# Patient Record
Sex: Male | Born: 1985 | Race: White | Hispanic: No | Marital: Married | Smoking: Never smoker
Health system: Southern US, Community
[De-identification: ages and names within clinical notes are randomized; demographics above are authoritative.]

## PROBLEM LIST (undated history)

## (undated) DIAGNOSIS — E0590 Thyrotoxicosis, unspecified without thyrotoxic crisis or storm: Secondary | ICD-10-CM

## (undated) DIAGNOSIS — F419 Anxiety disorder, unspecified: Secondary | ICD-10-CM

---

## 2013-08-16 ENCOUNTER — Encounter (HOSPITAL_COMMUNITY): Payer: Self-pay | Admitting: Emergency Medicine

## 2013-08-16 ENCOUNTER — Emergency Department (HOSPITAL_COMMUNITY): Payer: Self-pay

## 2013-08-16 ENCOUNTER — Emergency Department (HOSPITAL_COMMUNITY)
Admission: EM | Admit: 2013-08-16 | Discharge: 2013-08-17 | Disposition: A | Discharge status: Home / Self Care | Payer: Self-pay | Attending: Emergency Medicine | Admitting: Emergency Medicine

## 2013-08-16 DIAGNOSIS — T07XXXA Unspecified multiple injuries, initial encounter: Secondary | ICD-10-CM | POA: Insufficient documentation

## 2013-08-16 DIAGNOSIS — M7918 Myalgia, other site: Secondary | ICD-10-CM

## 2013-08-16 DIAGNOSIS — Z79899 Other long term (current) drug therapy: Secondary | ICD-10-CM | POA: Insufficient documentation

## 2013-08-16 DIAGNOSIS — F411 Generalized anxiety disorder: Secondary | ICD-10-CM | POA: Insufficient documentation

## 2013-08-16 DIAGNOSIS — E0590 Thyrotoxicosis, unspecified without thyrotoxic crisis or storm: Secondary | ICD-10-CM | POA: Insufficient documentation

## 2013-08-16 DIAGNOSIS — F1010 Alcohol abuse, uncomplicated: Secondary | ICD-10-CM | POA: Insufficient documentation

## 2013-08-16 DIAGNOSIS — W108XXA Fall (on) (from) other stairs and steps, initial encounter: Secondary | ICD-10-CM | POA: Insufficient documentation

## 2013-08-16 DIAGNOSIS — S40019A Contusion of unspecified shoulder, initial encounter: Secondary | ICD-10-CM | POA: Insufficient documentation

## 2013-08-16 DIAGNOSIS — Y9289 Other specified places as the place of occurrence of the external cause: Secondary | ICD-10-CM | POA: Insufficient documentation

## 2013-08-16 DIAGNOSIS — W19XXXA Unspecified fall, initial encounter: Secondary | ICD-10-CM

## 2013-08-16 DIAGNOSIS — R109 Unspecified abdominal pain: Secondary | ICD-10-CM | POA: Insufficient documentation

## 2013-08-16 DIAGNOSIS — Z880 Allergy status to penicillin: Secondary | ICD-10-CM | POA: Insufficient documentation

## 2013-08-16 DIAGNOSIS — F919 Conduct disorder, unspecified: Secondary | ICD-10-CM | POA: Insufficient documentation

## 2013-08-16 DIAGNOSIS — Y939 Activity, unspecified: Secondary | ICD-10-CM | POA: Insufficient documentation

## 2013-08-16 HISTORY — DX: Anxiety disorder, unspecified: F41.9

## 2013-08-16 HISTORY — DX: Thyrotoxicosis, unspecified without thyrotoxic crisis or storm: E05.90

## 2013-08-16 LAB — RAPID URINE DRUG SCREEN, HOSP PERFORMED
Amphetamines: NOT DETECTED
Barbiturates: NOT DETECTED
Benzodiazepines: NOT DETECTED
COCAINE: NOT DETECTED
Opiates: NOT DETECTED
Tetrahydrocannabinol: NOT DETECTED

## 2013-08-16 LAB — PROTIME-INR
INR: 0.84 (ref 0.00–1.49)
Prothrombin Time: 11.4 seconds — ABNORMAL LOW (ref 11.6–15.2)

## 2013-08-16 LAB — COMPREHENSIVE METABOLIC PANEL
ALBUMIN: 4.2 g/dL (ref 3.5–5.2)
ALT: 24 U/L (ref 0–53)
AST: 24 U/L (ref 0–37)
Alkaline Phosphatase: 50 U/L (ref 39–117)
BILIRUBIN TOTAL: 0.3 mg/dL (ref 0.3–1.2)
BUN: 11 mg/dL (ref 6–23)
CHLORIDE: 102 meq/L (ref 96–112)
CO2: 20 mEq/L (ref 19–32)
CREATININE: 1.11 mg/dL (ref 0.50–1.35)
Calcium: 9.6 mg/dL (ref 8.4–10.5)
GFR calc Af Amer: >90 mL/min (ref >90)
GFR calc non Af Amer: 88 mL/min — ABNORMAL LOW (ref >90)
Glucose, Bld: 89 mg/dL (ref 70–99)
Potassium: 3.8 mEq/L (ref 3.7–5.3)
Sodium: 138 mEq/L (ref 137–147)
Total Protein: 7.7 g/dL (ref 6.0–8.3)

## 2013-08-16 LAB — CBC
HEMATOCRIT: 42.3 % (ref 39.0–52.0)
Hemoglobin: 14.9 g/dL (ref 13.0–17.0)
MCH: 30.7 pg (ref 26.0–34.0)
MCHC: 35.2 g/dL (ref 30.0–36.0)
MCV: 87 fL (ref 78.0–100.0)
PLATELETS: 292 10*3/uL (ref 150–400)
RBC: 4.86 MIL/uL (ref 4.22–5.81)
RDW: 12.4 % (ref 11.5–15.5)
WBC: 9 10*3/uL (ref 4.0–10.5)

## 2013-08-16 LAB — ETHANOL: Alcohol, Ethyl (B): 233 mg/dL — ABNORMAL HIGH (ref 0–11)

## 2013-08-16 LAB — SAMPLE TO BLOOD BANK

## 2013-08-16 LAB — I-STAT CG4 LACTIC ACID, ED
LACTIC ACID, VENOUS: 2.23 mmol/L — AB (ref 0.5–2.2)
LACTIC ACID, VENOUS: 1.6 mmol/L (ref 0.5–2.2)

## 2013-08-16 MED ORDER — MORPHINE SULFATE 2 MG/ML IJ SOLN
2.0000 mg | Freq: Once | INTRAMUSCULAR | Status: AC
  Administered 2013-08-16: 2 mg via INTRAVENOUS
  Filled 2013-08-16: qty 1

## 2013-08-16 MED ORDER — HYDROCODONE-ACETAMINOPHEN 5-325 MG PO TABS
1.0000 | ORAL_TABLET | Freq: Once | ORAL | Status: AC
  Administered 2013-08-16: 1 via ORAL
  Filled 2013-08-16: qty 1

## 2013-08-16 MED ORDER — HYDROCODONE-ACETAMINOPHEN 5-325 MG PO TABS
1.0000 | ORAL_TABLET | Freq: Four times a day (QID) | ORAL | Status: AC | PRN
Start: 2013-08-16 — End: ?

## 2013-08-16 MED ORDER — SODIUM CHLORIDE 0.9 % IV BOLUS (SEPSIS)
1000.0000 mL | Freq: Once | INTRAVENOUS | Status: AC
  Administered 2013-08-16: 1000 mL via INTRAVENOUS

## 2013-08-16 MED ORDER — IOHEXOL 300 MG/ML  SOLN
100.0000 mL | Freq: Once | INTRAMUSCULAR | Status: AC | PRN
  Administered 2013-08-16: 100 mL via INTRAVENOUS

## 2013-08-16 NOTE — ED Notes (Signed)
Dr Lockwood given a copy of lactic acid results 2.23 

## 2013-08-16 NOTE — Progress Notes (Signed)
Chaplain responded to trauma, level 2. Upon consulting with nursing staff, learned that patient had no emotional/spiritual needs at this time. Please page for follow up.   Maurene CapesHillary D Irusta [phone]

## 2013-08-16 NOTE — Discharge Instructions (Signed)
Musculoskeletal Pain °Musculoskeletal pain is muscle and boney aches and pains. These pains can occur in any part of the body. Your caregiver may treat you without knowing the cause of the pain. They may treat you if blood or urine tests, X-rays, and other tests were normal.  °CAUSES °There is often not a definite cause or reason for these pains. These pains may be caused by a type of germ (virus). The discomfort may also come from overuse. Overuse includes working out too hard when your body is not fit. Boney aches also come from weather changes. Bone is sensitive to atmospheric pressure changes. °HOME CARE INSTRUCTIONS  °· Ask when your test results will be ready. Make sure you get your test results. °· Only take over-the-counter or prescription medicines for pain, discomfort, or fever as directed by your caregiver. If you were given medications for your condition, do not drive, operate machinery or power tools, or sign legal documents for 24 hours. Do not drink alcohol. Do not take sleeping pills or other medications that may interfere with treatment. °· Continue all activities unless the activities cause more pain. When the pain lessens, slowly resume normal activities. Gradually increase the intensity and duration of the activities or exercise. °· During periods of severe pain, bed rest may be helpful. Lay or sit in any position that is comfortable. °· Putting ice on the injured area. °· Put ice in a bag. °· Place a towel between your skin and the bag. °· Leave the ice on for 15 to 20 minutes, 3 to 4 times a day. °· Follow up with your caregiver for continued problems and no reason can be found for the pain. If the pain becomes worse or does not go away, it may be necessary to repeat tests or do additional testing. Your caregiver may need to look further for a possible cause. °SEEK IMMEDIATE MEDICAL CARE IF: °· You have pain that is getting worse and is not relieved by medications. °· You develop chest pain  that is associated with shortness or breath, sweating, feeling sick to your stomach (nauseous), or throw up (vomit). °· Your pain becomes localized to the abdomen. °· You develop any new symptoms that seem different or that concern you. °MAKE SURE YOU:  °· Understand these instructions. °· Will watch your condition. °· Will get help right away if you are not doing well or get worse. °Document Released: 04/16/2005 Document Revised: 07/09/2011 Document Reviewed: 12/19/2012 °ExitCare® Patient Information ©2014 [employer]. ° °

## 2013-08-16 NOTE — ED Notes (Signed)
Per [facility] - pt from a friends house this evening, w/ heavy ETOH on board, pt fell down front porch stairs approx 14 (7-388ft) and ambulatory on scene - per EMS pt combative en route while trying to immobilize pt. On arrival pt A&Ox4 agitated w/ c-collar - c/o lower back pain, left shoulder pain (ecchymosis noted to left shoulder), MAE.

## 2013-08-16 NOTE — ED Notes (Signed)
Family at beside. Family given emotional support. 

## 2013-08-16 NOTE — ED Notes (Signed)
Patient transported to X-ray 

## 2013-08-16 NOTE — ED Provider Notes (Signed)
CSN: [id number]     Arrival date & time 08/16/13  2021 History   First MD Initiated Contact with Patient 08/16/13 2039     Chief Complaint  Patient presents with  . Trauma  . Fall     (Consider location/radiation/quality/duration/timing/severity/associated sxs/prior Treatment) HPI  This is a 28 y.o. male with PMH of hyperthyroidism, anxiety, presenting as level II trauma with pain after fall. Pain is in left shoulder, left upper arm, lower back. Onset prior to arrival. Persistent. Cannot characterize. No meds taken. No radiation. Patient denies weakness, numbness, tingling. Positive for abdominal pain as well.  Mechanism a fall prior to arrival. Patient was intoxicated with alcohol. Felt down at estimated 8 feet of steps. Unsure if he hit his neck. Negative for loss of consciousness. Negative for amnesia. Patient was ambulatory after incident. Upon arrival of EMS, patient was combative; however, they were able to calm him down, place him in a cervical collar, place him on a hard backboard, and transported him here. Unable to take blood pressure in transit due to combativeness.  Past Medical History  Diagnosis Date  . Hyperthyroidism   . Anxiety    History reviewed. No pertinent past surgical history. History reviewed. No pertinent family history. History  Substance Use Topics  . Smoking status: Never Smoker   . Smokeless tobacco: Not on file  . Alcohol Use: Yes    Review of Systems  Constitutional: Negative for fever and chills.  HENT: Negative for facial swelling.   Eyes: Negative for pain and visual disturbance.  Respiratory: Negative for chest tightness and shortness of breath.   Cardiovascular: Negative for chest pain.  Gastrointestinal: Positive for abdominal pain. Negative for nausea and vomiting.  Genitourinary: Negative for dysuria.  Musculoskeletal: Positive for arthralgias and back pain. Negative for myalgias.  Skin: Positive for color change.  Neurological:  Negative for headaches.  Psychiatric/Behavioral: Positive for behavioral problems (combatitiveness has resolved).      Allergies  Penicillins  Home Medications   Prior to Admission medications   Medication Sig Start Date End Date Taking? Authorizing Provider  FLUoxetine (PROZAC) 40 MG capsule Take 40 mg by mouth daily.   Yes Historical Provider, MD  ibuprofen (ADVIL,MOTRIN) 200 MG tablet Take 600 mg by mouth every 6 (six) hours as needed for mild pain.   Yes Historical Provider, MD  levothyroxine (SYNTHROID, LEVOTHROID) 175 MCG tablet Take 175 mcg by mouth daily before breakfast.   Yes Historical Provider, MD  LORazepam (ATIVAN) 1 MG tablet Take 1 mg by mouth 2 (two) times daily.   Yes Historical Provider, MD   BP 164/84  Pulse 130  Temp(Src) 99.3 F (37.4 C) (Oral)  Resp 28  Ht 5\' 7"  (1.702 m)  Wt 250 lb (113.399 kg)  BMI 39.15 kg/m2  SpO2 100% Physical Exam  Constitutional: He is oriented to person, place, and time. He appears well-developed and well-nourished. No distress.  HENT:  Head: Normocephalic and atraumatic.  Mouth/Throat: No oropharyngeal exudate.  Eyes: Conjunctivae are normal. Pupils are equal, round, and reactive to light. No scleral icterus.  Neck: Normal range of motion. No tracheal deviation present. No thyromegaly present.  Cardiovascular: Normal rate, regular rhythm and normal heart sounds.  Exam reveals no gallop and no friction rub.   No murmur heard. Pulmonary/Chest: Effort normal and breath sounds normal. No stridor. No respiratory distress. He has no wheezes. He has no rales. He exhibits no tenderness.  Abdominal: Soft. He exhibits no distension and no mass. There is  tenderness. There is no rebound and no guarding.  Musculoskeletal: Normal range of motion. He exhibits no edema.  Positive for midline TTP at around T10 without stepoff or deformities  Neurological: He is alert and oriented to person, place, and time.  Skin: Skin is warm and dry. He is  not diaphoretic.  Ecchymosis to the left upper arm.  LUE is NVI    ED Course  Procedures (including critical care time) Labs Review Labs Reviewed  COMPREHENSIVE METABOLIC PANEL - Abnormal; Notable for the following:    GFR calc non Af Amer 88 (*)    All other components within normal limits  ETHANOL - Abnormal; Notable for the following:    Alcohol, Ethyl (B) 233 (*)    All other components within normal limits  PROTIME-INR - Abnormal; Notable for the following:    Prothrombin Time 11.4 (*)    All other components within normal limits  I-STAT CG4 LACTIC ACID, ED - Abnormal; Notable for the following:    Lactic Acid, Venous 2.23 (*)    All other components within normal limits  CBC  URINE RAPID DRUG SCREEN (HOSP PERFORMED)  CDS SEROLOGY  I-STAT CG4 LACTIC ACID, ED  SAMPLE TO BLOOD BANK    Imaging Review Ct Head Wo Contrast  08/16/2013   CLINICAL DATA:  Trauma  EXAM: CT HEAD WITHOUT CONTRAST  CT CERVICAL SPINE WITHOUT CONTRAST  TECHNIQUE: Multidetector CT imaging of the head and cervical spine was performed following the standard protocol without intravenous contrast. Multiplanar CT image reconstructions of the cervical spine were also generated.  COMPARISON:  CT ABD/PELVIS W CM dated 08/16/2013; CT CHEST W/CM dated 08/16/2013; CT L SPINE W/O CM dated 08/16/2013; CT T SPINE W/O CM dated 08/16/2013; CT HEAD W/O CM dated 06/28/2013  FINDINGS: CT HEAD FINDINGS  No mass effect, midline shift, or acute intracranial hemorrhage. Cranium is intact.  CT CERVICAL SPINE FINDINGS  No acute fracture.  No dislocation.  No obvious soft tissue injury.  IMPRESSION: Negative head CT.  No evidence of cervical spine injury.   Electronically Signed   By: Maryclare Bean M.D.   On: 08/16/2013 22:29   Ct Chest W Contrast  08/16/2013   CLINICAL DATA:  Trauma  EXAM: CT CHEST, ABDOMEN, AND PELVIS WITH CONTRAST  TECHNIQUE: Multidetector CT imaging of the chest, abdomen and pelvis was performed following the standard protocol  during bolus administration of intravenous contrast.  CONTRAST:  OMNIPAQUE IOHEXOL 300 MG/ML  SOLN  COMPARISON:  None.  FINDINGS: CT CHEST FINDINGS  No evidence of aortic injury or mediastinal hemorrhage.  There is moderate wall thickening of the distal half of the esophagus. Small hiatal hernia is suspected.  No pneumothorax.  No pleural effusion  Subsegmental atelectasis at the lung bases.  No acute bony deformity.  CT ABDOMEN AND PELVIS FINDINGS  Liver, gallbladder, spleen, adrenal glands, and kidneys are within normal limits. Pancreas is unremarkable.  Normal appendix.  Bladder is distended.  Unremarkable prostate gland.  No free-fluid.  No obvious retroperitoneal adenopathy.  No acute bony pathology.  IMPRESSION: No evidence of thoracic, abdominal, or pelvic injury.  Wall thickening of the esophagus. Consider gastroesophageal reflux wore an inflammatory process.   Electronically Signed   By: Maryclare Bean M.D.   On: 08/16/2013 22:33   Ct Cervical Spine Wo Contrast  08/16/2013   CLINICAL DATA:  Trauma  EXAM: CT HEAD WITHOUT CONTRAST  CT CERVICAL SPINE WITHOUT CONTRAST  TECHNIQUE: Multidetector CT imaging of the head and  cervical spine was performed following the standard protocol without intravenous contrast. Multiplanar CT image reconstructions of the cervical spine were also generated.  COMPARISON:  CT ABD/PELVIS W CM dated 08/16/2013; CT CHEST W/CM dated 08/16/2013; CT L SPINE W/O CM dated 08/16/2013; CT T SPINE W/O CM dated 08/16/2013; CT HEAD W/O CM dated 06/28/2013  FINDINGS: CT HEAD FINDINGS  No mass effect, midline shift, or acute intracranial hemorrhage. Cranium is intact.  CT CERVICAL SPINE FINDINGS  No acute fracture.  No dislocation.  No obvious soft tissue injury.  IMPRESSION: Negative head CT.  No evidence of cervical spine injury.   Electronically Signed   By: Maryclare BeanArt  Hoss M.D.   On: 08/16/2013 22:29   Ct Thoracic Spine Wo Contrast  08/16/2013   CLINICAL DATA:  Trauma  EXAM: CT THORACIC AND LUMBAR  SPINE WITHOUT CONTRAST  TECHNIQUE: Multidetector CT imaging of the thoracic and lumbar spine was performed without contrast. Multiplanar CT image reconstructions were also generated.  COMPARISON:  None.  FINDINGS: CT THORACIC SPINE FINDINGS  No vertebral compression deformity. Mild degenerative disc disease with anterior osteophytes throughout the thoracic spine. Right posterior lateral and foraminal osteophytes at T11-T12 have a chronic appearance. This does cause some foraminal narrowing. No obvious spinal hematoma.  CT LUMBAR SPINE FINDINGS  No acute fracture. No vertebral compression deformity. No destructive bone lesion. No obvious soft tissue injury.  IMPRESSION: CT THORACIC SPINE IMPRESSION  No acute bony injury.  Degenerative changes.  CT LUMBAR SPINE IMPRESSION  No acute bony injury.   Electronically Signed   By: Maryclare BeanArt  Hoss M.D.   On: 08/16/2013 22:39   Ct Lumbar Spine Wo Contrast  08/16/2013   CLINICAL DATA:  Trauma  EXAM: CT THORACIC AND LUMBAR SPINE WITHOUT CONTRAST  TECHNIQUE: Multidetector CT imaging of the thoracic and lumbar spine was performed without contrast. Multiplanar CT image reconstructions were also generated.  COMPARISON:  None.  FINDINGS: CT THORACIC SPINE FINDINGS  No vertebral compression deformity. Mild degenerative disc disease with anterior osteophytes throughout the thoracic spine. Right posterior lateral and foraminal osteophytes at T11-T12 have a chronic appearance. This does cause some foraminal narrowing. No obvious spinal hematoma.  CT LUMBAR SPINE FINDINGS  No acute fracture. No vertebral compression deformity. No destructive bone lesion. No obvious soft tissue injury.  IMPRESSION: CT THORACIC SPINE IMPRESSION  No acute bony injury.  Degenerative changes.  CT LUMBAR SPINE IMPRESSION  No acute bony injury.   Electronically Signed   By: Maryclare BeanArt  Hoss M.D.   On: 08/16/2013 22:39   Ct Abdomen Pelvis W Contrast  08/16/2013   CLINICAL DATA:  Trauma  EXAM: CT CHEST, ABDOMEN, AND  PELVIS WITH CONTRAST  TECHNIQUE: Multidetector CT imaging of the chest, abdomen and pelvis was performed following the standard protocol during bolus administration of intravenous contrast.  CONTRAST:  100mL OMNIPAQUE IOHEXOL 300 MG/ML  SOLN  COMPARISON:  None.  FINDINGS: CT CHEST FINDINGS  No evidence of aortic injury or mediastinal hemorrhage.  There is moderate wall thickening of the distal half of the esophagus. Small hiatal hernia is suspected.  No pneumothorax.  No pleural effusion  Subsegmental atelectasis at the lung bases.  No acute bony deformity.  CT ABDOMEN AND PELVIS FINDINGS  Liver, gallbladder, spleen, adrenal glands, and kidneys are within normal limits. Pancreas is unremarkable.  Normal appendix.  Bladder is distended.  Unremarkable prostate gland.  No free-fluid.  No obvious retroperitoneal adenopathy.  No acute bony pathology.  IMPRESSION: No evidence of thoracic, abdominal, or pelvic  injury.  Wall thickening of the esophagus. Consider gastroesophageal reflux wore an inflammatory process.   Electronically Signed   By: Maryclare Bean M.D.   On: 08/16/2013 22:33   Dg Pelvis Portable  08/16/2013   CLINICAL DATA:  Fall  EXAM: PORTABLE PELVIS 1-2 VIEWS  COMPARISON:  None.  FINDINGS: There is no evidence of pelvic fracture or diastasis. No other pelvic bone lesions are seen.  IMPRESSION: Negative.   Electronically Signed   By: Maryclare Bean M.D.   On: 08/16/2013 21:14   Dg Chest Portable 1 View  08/16/2013   CLINICAL DATA:  Fall  EXAM: PORTABLE CHEST - 1 VIEW  COMPARISON:  DG CHEST 2V dated 06/28/2013; DG PELVIS PORTABLE dated 08/16/2013  FINDINGS: Lungs are under aerated and clear. Normal heart size. No pneumothorax. No pleural effusion.  IMPRESSION: No active disease.   Electronically Signed   By: Maryclare Bean M.D.   On: 08/16/2013 21:13   Dg Shoulder Left  08/16/2013   CLINICAL DATA:  Trauma  EXAM: LEFT SHOULDER - 2+ VIEW  COMPARISON:  None.  FINDINGS: No acute fracture.  No dislocation.  IMPRESSION: No  acute bony pathology.   Electronically Signed   By: Maryclare Bean M.D.   On: 08/16/2013 23:08   Dg Humerus Left  08/16/2013   CLINICAL DATA:  Fall  EXAM: LEFT HUMERUS - 2+ VIEW  COMPARISON:  None.  FINDINGS: No acute fracture or dislocation.  IMPRESSION: No acute bony pathology.   Electronically Signed   By: Maryclare Bean M.D.   On: 08/16/2013 23:04   MDM   Final diagnoses:  None    This is a 28 y.o. male with PMH of hyperthyroidism, anxiety, presenting as level II trauma with pain after fall. Pain is in left shoulder, left upper arm, lower back. Onset prior to arrival. Persistent. Cannot characterize. No meds taken. No radiation. Patient denies weakness, numbness, tingling. Positive for abdominal pain as well.  Mechanism a fall prior to arrival. Patient was intoxicated with alcohol. Felt down at estimated 8 feet of steps. Unsure if he hit his neck. Negative for loss of consciousness. Negative for amnesia. Patient was ambulatory after incident. Upon arrival of EMS, patient was combative; however, they were able to calm him down, place him in a cervical collar, place him on a hard backboard, and transported him here. Unable to take blood pressure in transit due to combativeness.  The patient's airway is intact. His breath sounds are equal bilaterally. He has stable blood pressure. He is tachycardic to the 120s. We have established one 20-gauge IV in the right upper extremity. He has a GCS of 14 at this time. Has been properly exposed. Secondary examination is normal except for ecchymosis to the left upper arm, midline tenderness to palpation at the level of T10. Left upper extremity is neurovascularly intact. Patient has no neurologic deficits. Rectal tone is good. No blood in the rectal vault is appreciated. No blood at the meatus is appreciated.  Patient stable for x-ray. No emergent traumatic findings on portable chest x-ray or portable pelvic x-ray.  CT scan of the head, C-spine, chest, abdomen, and  pelvis all are without acute traumatic injury.  Pt has no midline TTP, no severe distracting pain, is alert, is not intoxicated, and has no neuro deficits.  I have cleared his cervical collar.  X-rays of left shoulder and left humerus are within normal limits.  He is alert, oriented. He speaks without slurring. He ambulates without ataxia.  He is no longer quickly intoxicated.  Pt stable for discharge, FU.  All questions answered.  Return precautions given.  I have discussed case and care has been guided by my attending physician, Dr. Jeraldine LootsLockwood.      Loma BostonStirling Jaysen Wey, MD 08/17/13 [phone]

## 2013-08-17 LAB — CDS SEROLOGY

## 2013-08-17 NOTE — ED Notes (Signed)
Pt ambulating independently w/ steady gait on d/c in no acute distress, A&Ox4. D/c instructions reviewed w/ pt and family - pt and family deny any further questions or concerns at present. Rx given x1  

## 2013-08-18 NOTE — ED Provider Notes (Signed)
This patient was seen in conjunction with the resident physician, Dr. Clearance CootsHarper.  The documentation accurately reflects the patient's encounter in the emergency department.  On my exam, this patient who is presenting after a fall was awake, but intoxicated. Patient's evaluation here consisted of multiple radiographic studies, monitoring. Studies were reassuring, and after a period of monitoring, the patient is no longer intoxicated and was ambulatory, in no distress. Patient was discharged in stable condition.  In the resuscitation bay, the patient was tachycardic, with a rate 121, regular, abnormal Pulse oximetry was 100% on room air normal   Alan Munchobert Jarrin Staley, MD 08/18/13 [phone]

## 2014-08-20 IMAGING — CR DG SHOULDER 2+V*L*
2 series · 2 of 2 positions shown · non-contrast
Comparison: None.

CLINICAL DATA: Trauma

EXAM:
LEFT SHOULDER - 2+ VIEW

[x shoulder ap left]
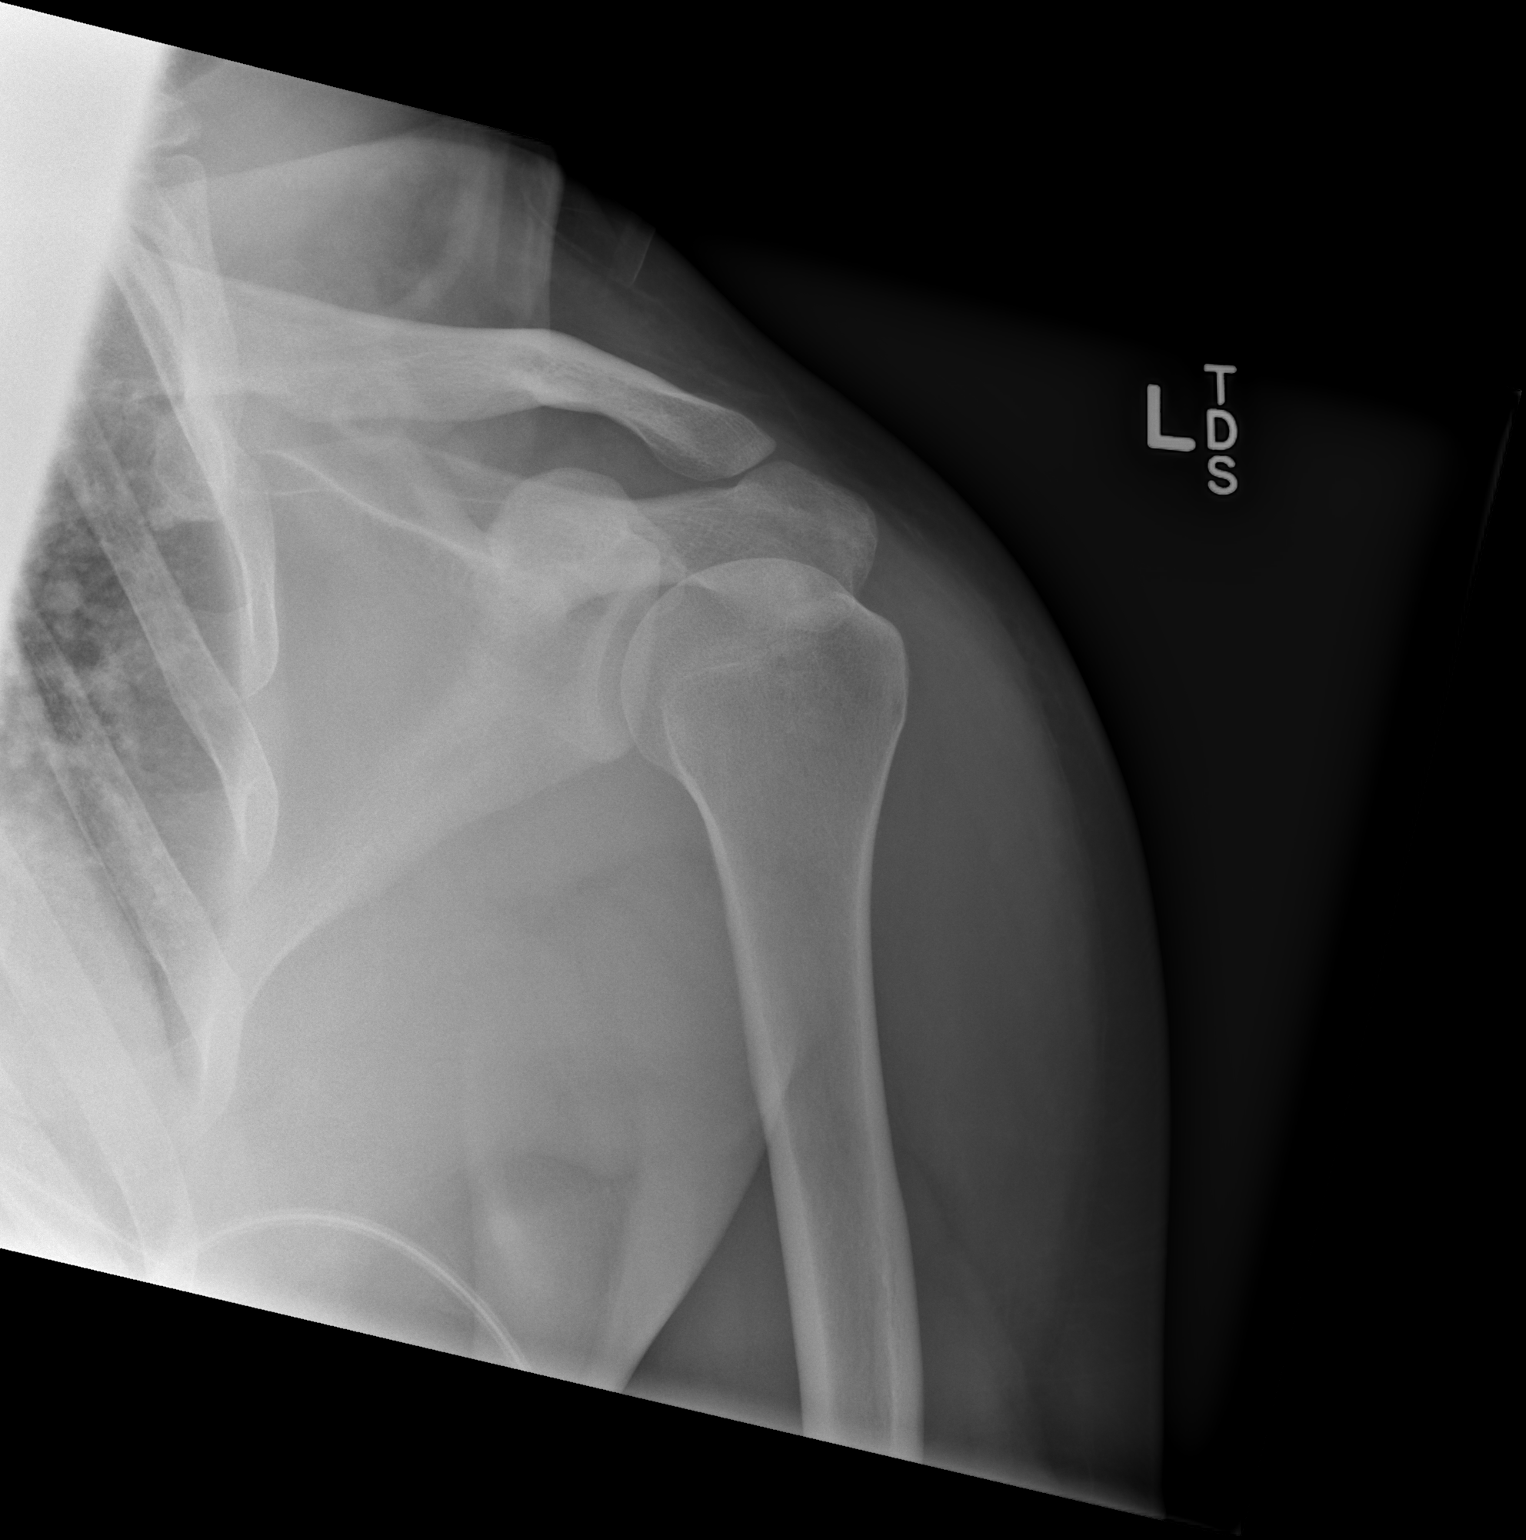

[x scapula y-view left]
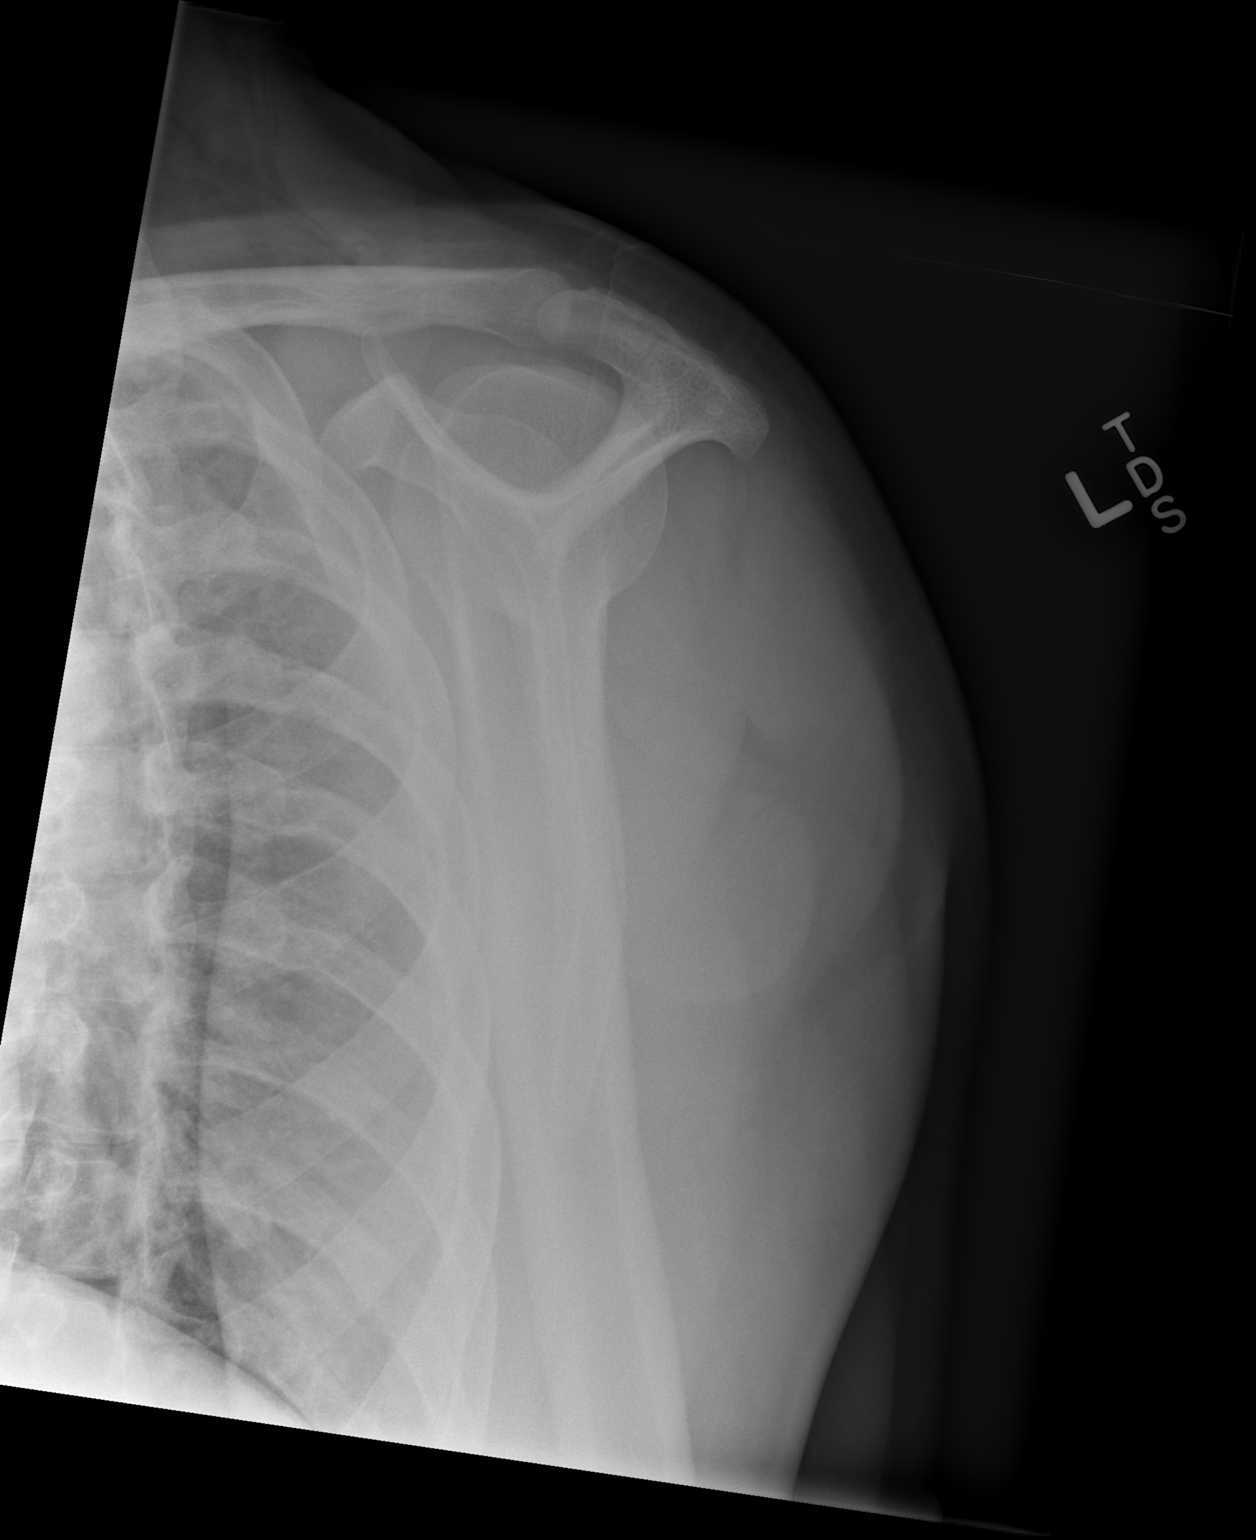

[2 of 2 positions shown; findings below may reference images not displayed]

FINDINGS: No acute fracture.  No dislocation.
IMPRESSION: No acute bony pathology.

## 2014-08-20 IMAGING — CT CT T SPINE W/O CM
3 of 6 series · 13 of 33 positions shown, 15 images · IV contrast (Omni 300)
Comparison: None.

CLINICAL DATA: Trauma

EXAM:
CT THORACIC AND LUMBAR SPINE WITHOUT CONTRAST
TECHNIQUE: Multidetector CT imaging of the thoracic and lumbar spine was
performed without contrast. Multiplanar CT image reconstructions
were also generated.

[Series 9: sagittal t spine · sagittal · 0.38mm/px · 5 of 61 slices shown]
[im 11/61  bone]
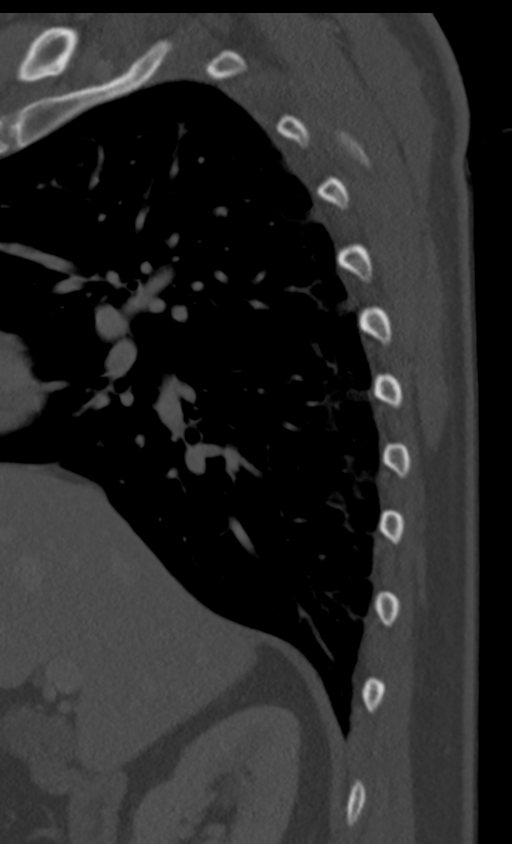
[im 21/61  bone]
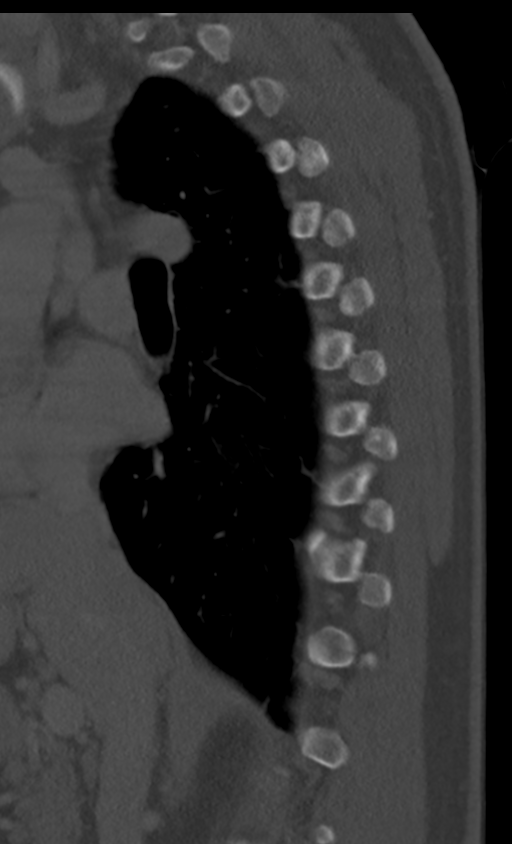
[im 31/61  bone]
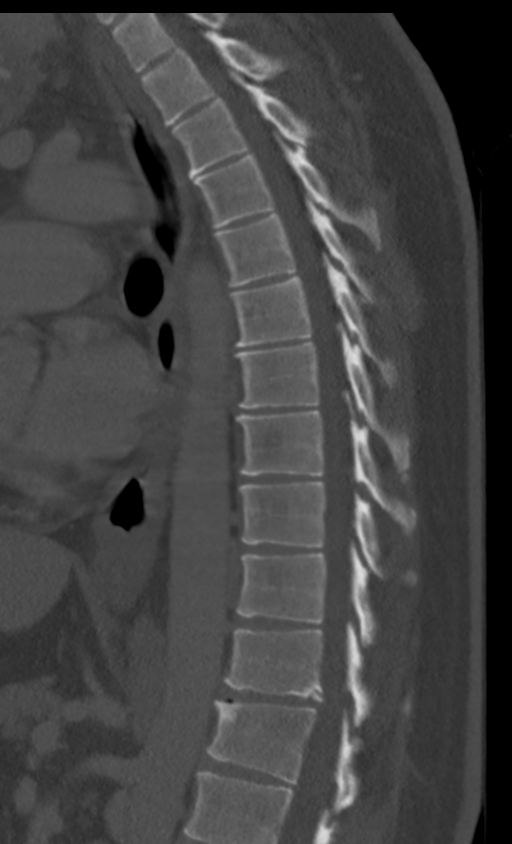
[im 41/61  bone]
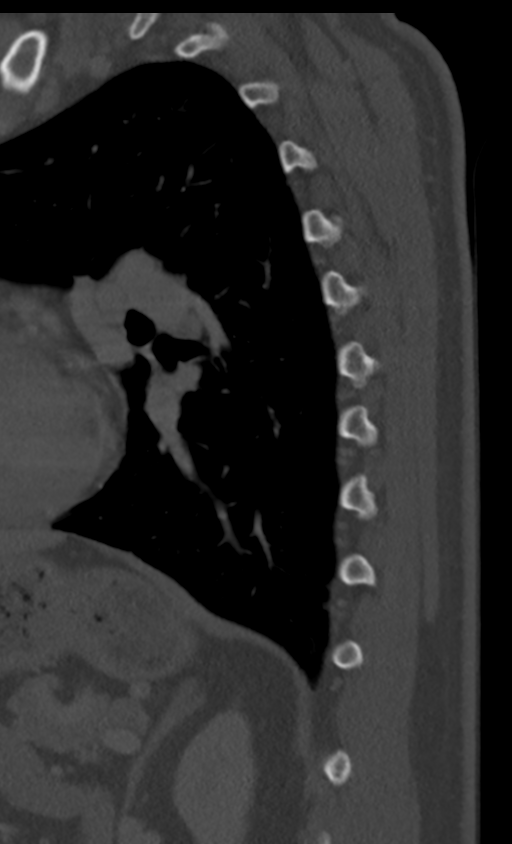
[im 51/61  bone]
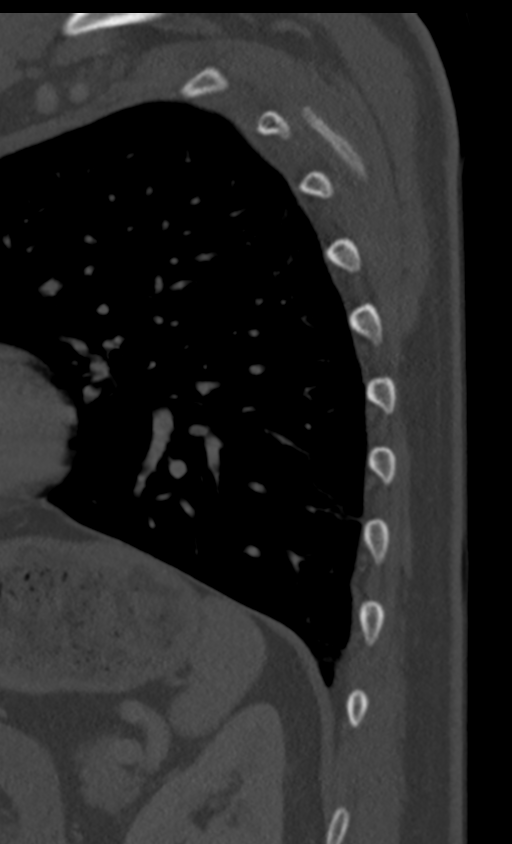

[Series 10: coronal t spine · coronal · 0.39mm/px · 1 of 59 slices shown]
[im 30/59  bone]
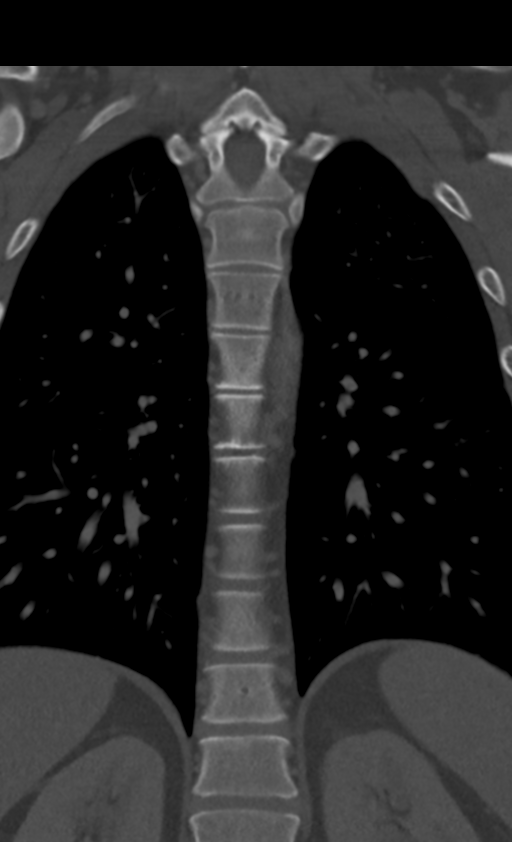

[Series 13: axial t spine · axial · 0.29mm/px · z∈[-244,-4]mm · 7 of 65 slices shown, 9 images]
[im 9/65  soft-tissue]
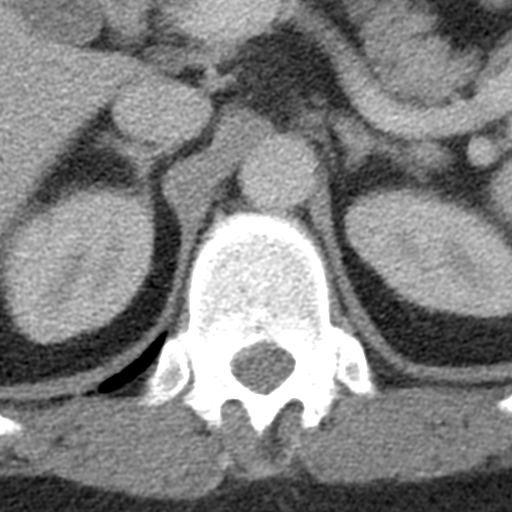
[im 9/65  bone]
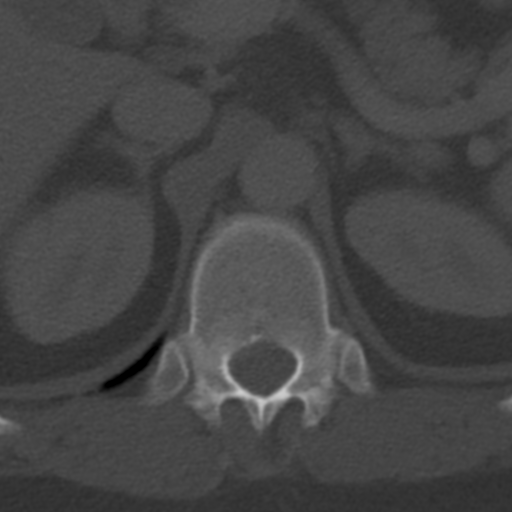
[im 17/65  bone]
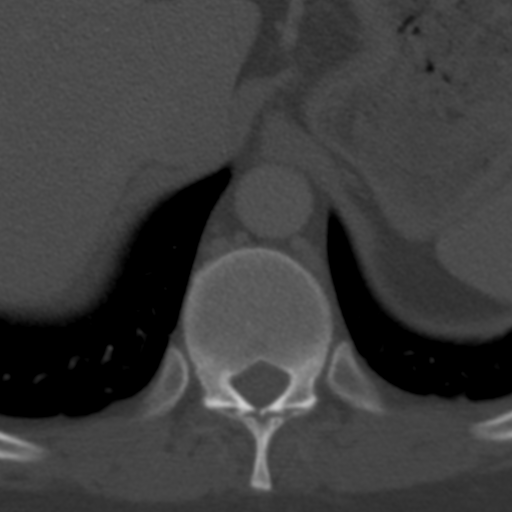
[im 25/65  bone]
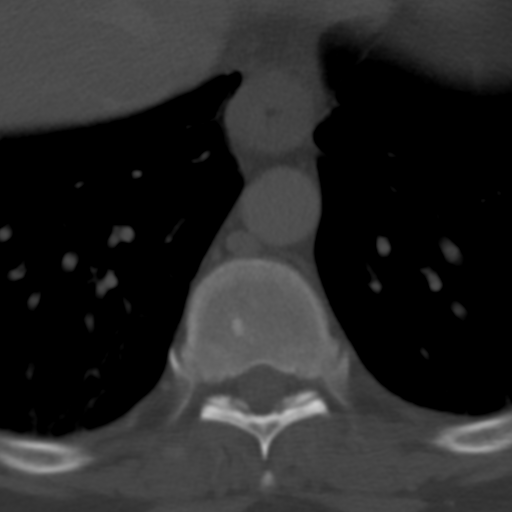
[im 33/65  bone]
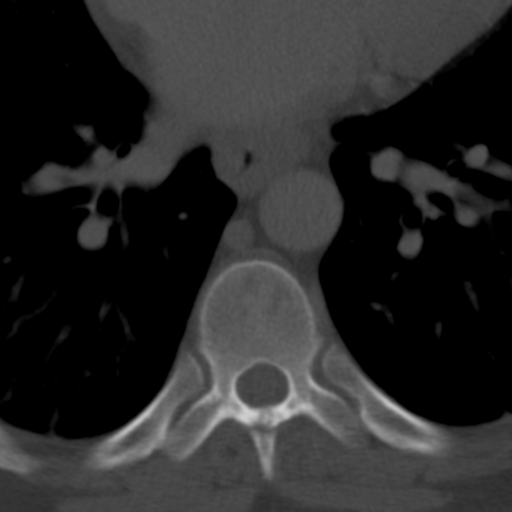
[im 41/65  soft-tissue]
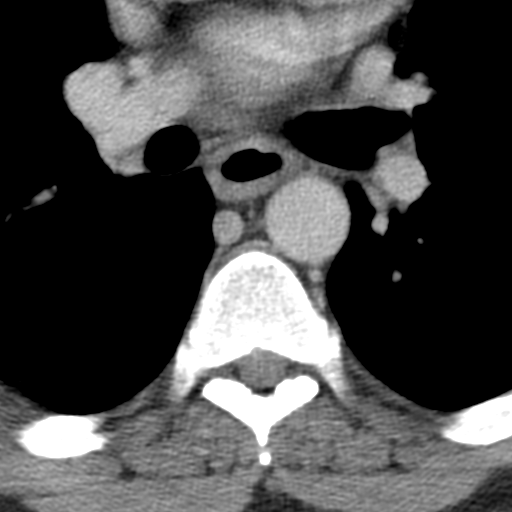
[im 41/65  bone]
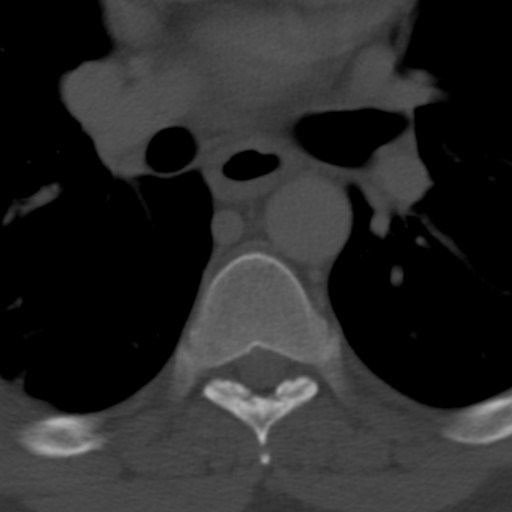
[im 49/65  bone]
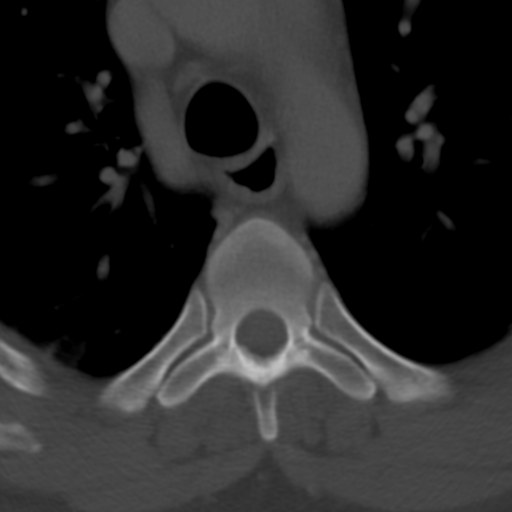
[im 57/65  bone]
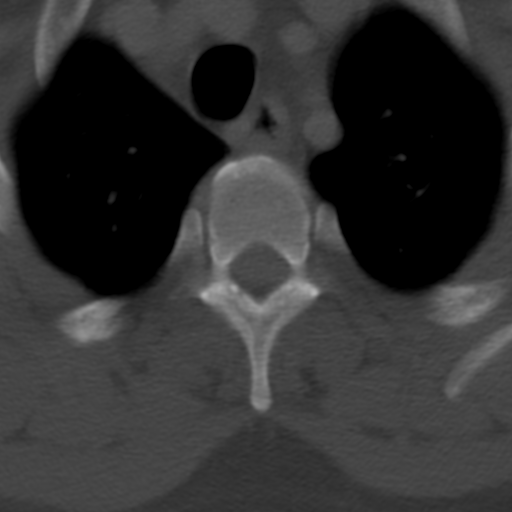

[13 of 33 positions shown; findings below may reference images not displayed]

FINDINGS: CT THORACIC SPINE FINDINGS

No vertebral compression deformity. Mild degenerative disc disease
with anterior osteophytes throughout the thoracic spine. Right
posterior lateral and foraminal osteophytes at T11-T12 have a
chronic appearance. This does cause some foraminal narrowing. No
obvious spinal hematoma.

CT LUMBAR SPINE FINDINGS

No acute fracture. No vertebral compression deformity. No
destructive bone lesion. No obvious soft tissue injury.
IMPRESSION: CT THORACIC SPINE IMPRESSION

No acute bony injury.  Degenerative changes.

CT LUMBAR SPINE IMPRESSION

No acute bony injury.
